# Patient Record
Sex: Male | Born: 1990 | Race: Black or African American | Hispanic: No | Marital: Single | Smoking: Never smoker
Health system: Southern US, Community
[De-identification: ages and names within clinical notes are randomized; demographics above are authoritative.]

---

## 2014-04-20 ENCOUNTER — Encounter (HOSPITAL_COMMUNITY): Payer: Self-pay | Admitting: Emergency Medicine

## 2014-04-20 ENCOUNTER — Emergency Department (HOSPITAL_COMMUNITY)
Admission: EM | Admit: 2014-04-20 | Discharge: 2014-04-20 | Disposition: A | Discharge status: Home / Self Care | Attending: Emergency Medicine | Admitting: Emergency Medicine

## 2014-04-20 DIAGNOSIS — J029 Acute pharyngitis, unspecified: Secondary | ICD-10-CM | POA: Diagnosis present

## 2014-04-20 DIAGNOSIS — J36 Peritonsillar abscess: Secondary | ICD-10-CM | POA: Insufficient documentation

## 2014-04-20 LAB — RAPID STREP SCREEN (MED CTR MEBANE ONLY): Streptococcus, Group A Screen (Direct): NEGATIVE

## 2014-04-20 MED ORDER — AMOXICILLIN 500 MG PO CAPS: 1000.0000 mg | ORAL_CAPSULE | Freq: Three times a day (TID) | ORAL | Status: AC

## 2014-04-20 MED ORDER — IBUPROFEN 100 MG/5ML PO SUSP: 400.0000 mg | Freq: Four times a day (QID) | ORAL | Status: AC | PRN

## 2014-04-20 MED ORDER — AMOXICILLIN 500 MG PO CAPS
1000.0000 mg | ORAL_CAPSULE | Freq: Once | ORAL | Status: AC
  Administered 2014-04-20: 1000 mg via ORAL
  Filled 2014-04-20: qty 2

## 2014-04-20 MED ORDER — HYDROCODONE-ACETAMINOPHEN 7.5-325 MG/15ML PO SOLN: 10.0000 mL | Freq: Four times a day (QID) | ORAL | Status: AC | PRN

## 2014-04-20 NOTE — ED Provider Notes (Signed)
CSN: [id number]     Arrival date & time 04/20/14  [id number] History   First MD Initiated Contact with Patient 04/20/14 [phone]     Chief Complaint  Patient presents with  . Sore Throat     (Consider location/radiation/quality/duration/timing/severity/associated sxs/prior Treatment) HPI  Pt is a healthy 24yo male, presenting to ED with c/o sore throat that started 1 week ago, significantly worse starting yesterday, associated decreased appetite due to pain with swallowing, pain is 8/10 at worst, as well as mild intermittent headache since last night. He reports having had strep throat 2 other times since Thanksgiving of 2015, was admitted in the beginning of December for abscesses to his tonsils.  States he was admitted for 2 nights in [place] for an abscess at the end of November 2015. States the first night he was not able to be seen by the specialist but they did drain it.  States this time feels similar but due to having been through the same scenario just 2 months ago, pt feels he is better able to cope with his symptoms.  He has tried ibuprofen as home with mild to moderate relief. Has been able to keep down fluids. Denies difficulty breathing. No sick contacts or recent travel.    History reviewed. No pertinent past medical history. History reviewed. No pertinent past surgical history. No family history on file. History  Substance Use Topics  . Smoking status: Never Smoker   . Smokeless tobacco: Not on file  . Alcohol Use: No    Review of Systems  Constitutional: Positive for appetite change. Negative for fever and chills.  HENT: Positive for sore throat, trouble swallowing and voice change. Negative for congestion and drooling.   Respiratory: Negative for cough, shortness of breath, wheezing and stridor.   Gastrointestinal: Negative for nausea, vomiting, abdominal pain and diarrhea.  Neurological: Positive for headaches.  All other systems reviewed and are negative.     Allergies   Review of patient's allergies indicates no known allergies.  Home Medications   Prior to Admission medications   Medication Sig Start Date End Date Taking? Authorizing Provider  amoxicillin (AMOXIL) 500 MG capsule Take 2 capsules (1,000 mg total) by mouth 3 (three) times daily. Take 2 capsules 3 times daily for 3 days, then 1 capsule 3 times daily for 7 days 04/20/14   Junius FinnerErin O'Malley, PA-C  HYDROcodone-acetaminophen (HYCET) 7.5-325 mg/15 ml solution Take 10 mLs by mouth every 6 (six) hours as needed for moderate pain or severe pain. 04/20/14   Junius FinnerErin O'Malley, PA-C  ibuprofen (ADVIL,MOTRIN) 100 MG/5ML suspension Take 20 mLs (400 mg total) by mouth every 6 (six) hours as needed for fever, mild pain or moderate pain. 04/20/14   Junius FinnerErin O'Malley, PA-C   BP 144/79 mmHg  Pulse 102  Temp(Src) 98.4 F (36.9 C) (Oral)  Resp 18  SpO2 99% Physical Exam  Constitutional: He appears well-developed and well-nourished.  Pt sitting on side of exam bed, appears well, non-toxic. NAD  HENT:  Head: Normocephalic and atraumatic.  Right Ear: Hearing, tympanic membrane, external ear and ear canal normal.  Left Ear: Hearing, tympanic membrane, external ear and ear canal normal.  Nose: Nose normal.  Mouth/Throat: Mucous membranes are normal. No trismus in the jaw. Uvula swelling ( mild with slight deviation to the right) present. Oropharyngeal exudate, posterior oropharyngeal edema, posterior oropharyngeal erythema and tonsillar abscesses present.  Managing secretions well  Eyes: Conjunctivae are normal. No scleral icterus.  Neck: Normal range of motion. Neck supple. No  JVD present. No tracheal deviation present. No thyromegaly present.  Mildly muffled voice.  Cardiovascular: Normal rate, regular rhythm and normal heart sounds.   Pulmonary/Chest: Effort normal and breath sounds normal. No stridor. No respiratory distress. He has no wheezes. He has no rales. He exhibits no tenderness.  Abdominal: Soft. Bowel sounds  are normal. He exhibits no distension and no mass. There is no tenderness. There is no rebound and no guarding.  Musculoskeletal: Normal range of motion.  Lymphadenopathy:    He has cervical adenopathy ( right submandibular).  Neurological: He is alert.  Skin: Skin is warm and dry.  Nursing note and vitals reviewed.   ED Course  Procedures (including critical care time) Labs Review Labs Reviewed  RAPID STREP SCREEN  CULTURE, GROUP A STREP    Imaging Review No results found.   EKG Interpretation None      MDM   Final diagnoses:  Peritonsillar abscess   Pt with hx of tonsillar abscess in [place] 1-2 months ago, presenting to ED with sore throat. Exam c/w peritonsillar abscess. No respiratory distress, no stridor. Able to manage secretions.  Rapid strep: negative. Discussed pt with Dr. Effie Shy who also examined pt. Agrees with plan to consult with ENT.  09:54AM: Consulted with Dr. Lazarus Salines, ENT, recommended starting pt on amox 1g TID for 3 days, then 500 TID for 7 days. Pt advised to call office in the morning to schedule an appointment for follow up. Discussed plan with pt who states he would like to try the amoxicillin pills and agrees with tx plan to be discharged home with instructions on f/u with Dr. Lazarus Salines. Encouraged lots of rest and oral hydration.    Pt able to keep down PO amoxicillin and fluids in ED. States he feels comfortable being discharged home. Rx: amoxicillin, lortab and ibuprofen. Home care instructions provided Return precautions provided. Pt verbalized understanding and agreement with tx plan.    Junius Finner, PA-C 04/20/14 1030  Flint Melter, MD 04/20/14 [phone]

## 2014-04-20 NOTE — ED Notes (Signed)
PA At bedside

## 2014-04-20 NOTE — ED Notes (Addendum)
Pt from home c/o sore throat x 1 week. Pt reports this feels like strep throat (he had in nov/dec).  He reports also been admitted in the beginning of dec for abcess to tonsils.

## 2014-04-22 LAB — CULTURE, GROUP A STREP

## 2020-10-30 ENCOUNTER — Emergency Department (HOSPITAL_COMMUNITY): Payer: BC Managed Care – PPO

## 2020-10-30 ENCOUNTER — Encounter (HOSPITAL_COMMUNITY): Payer: Self-pay

## 2020-10-30 ENCOUNTER — Emergency Department (HOSPITAL_COMMUNITY)
Admission: EM | Admit: 2020-10-30 | Discharge: 2020-10-30 | Disposition: A | Discharge status: Home / Self Care | Payer: BC Managed Care – PPO | Attending: Emergency Medicine | Admitting: Emergency Medicine

## 2020-10-30 ENCOUNTER — Other Ambulatory Visit: Payer: Self-pay

## 2020-10-30 DIAGNOSIS — J36 Peritonsillar abscess: Secondary | ICD-10-CM | POA: Insufficient documentation

## 2020-10-30 DIAGNOSIS — J029 Acute pharyngitis, unspecified: Secondary | ICD-10-CM | POA: Diagnosis present

## 2020-10-30 LAB — BASIC METABOLIC PANEL
Anion gap: 10 (ref 5–15)
BUN: 15 mg/dL (ref 6–20)
CO2: 30 mmol/L (ref 22–32)
Calcium: 10.1 mg/dL (ref 8.9–10.3)
Chloride: 100 mmol/L (ref 98–111)
Creatinine, Ser: 1.15 mg/dL (ref 0.61–1.24)
GFR, Estimated: >60 mL/min (ref >60)
Glucose, Bld: 101 mg/dL — ABNORMAL HIGH (ref 70–99)
Potassium: 4 mmol/L (ref 3.5–5.1)
Sodium: 140 mmol/L (ref 135–145)

## 2020-10-30 LAB — CBC
HCT: 48.1 % (ref 39.0–52.0)
Hemoglobin: 15.5 g/dL (ref 13.0–17.0)
MCH: 27.1 pg (ref 26.0–34.0)
MCHC: 32.2 g/dL (ref 30.0–36.0)
MCV: 83.9 fL (ref 80.0–100.0)
Platelets: 339 10*3/uL (ref 150–400)
RBC: 5.73 MIL/uL (ref 4.22–5.81)
RDW: 13.6 % (ref 11.5–15.5)
WBC: 15.7 10*3/uL — ABNORMAL HIGH (ref 4.0–10.5)
nRBC: 0 % (ref 0.0–0.2)

## 2020-10-30 MED ORDER — CLINDAMYCIN HCL 300 MG PO CAPS: 300.0000 mg | ORAL_CAPSULE | Freq: Four times a day (QID) | ORAL | 0 refills | Status: DC

## 2020-10-30 MED ORDER — LACTATED RINGERS IV BOLUS
1000.0000 mL | Freq: Once | INTRAVENOUS | Status: AC
  Administered 2020-10-30: 1000 mL via INTRAVENOUS

## 2020-10-30 MED ORDER — CLINDAMYCIN HCL 300 MG PO CAPS: 300.0000 mg | ORAL_CAPSULE | Freq: Four times a day (QID) | ORAL | 0 refills | Status: AC

## 2020-10-30 MED ORDER — FENTANYL CITRATE (PF) 100 MCG/2ML IJ SOLN
100.0000 ug | Freq: Once | INTRAMUSCULAR | Status: AC
Start: 2020-10-30 — End: 2020-10-30
  Administered 2020-10-30: 100 ug via INTRAVENOUS
  Filled 2020-10-30: qty 2

## 2020-10-30 MED ORDER — CLINDAMYCIN PHOSPHATE 600 MG/50ML IV SOLN
600.0000 mg | Freq: Once | INTRAVENOUS | Status: AC
  Administered 2020-10-30: 600 mg via INTRAVENOUS
  Filled 2020-10-30: qty 50

## 2020-10-30 MED ORDER — FENTANYL CITRATE (PF) 100 MCG/2ML IJ SOLN
100.0000 ug | Freq: Once | INTRAMUSCULAR | Status: AC
  Administered 2020-10-30: 100 ug via INTRAVENOUS
  Filled 2020-10-30: qty 2

## 2020-10-30 MED ORDER — IOHEXOL 350 MG/ML SOLN
100.0000 mL | Freq: Once | INTRAVENOUS | Status: AC | PRN
  Administered 2020-10-30: 75 mL via INTRAVENOUS

## 2020-10-30 NOTE — Procedures (Signed)
Preop diagnosis: Bilateral peritonsillar abscess Postop diagnosis: same Procedure: Incision and drainage of bilateral peritonsillar abscesses Surgeon: Jenne Pane Anesth: Topical with cetacaine and local with 1% lidocaine with 1:100,000 epinephrine Compl: None Findings: Left greater than right abscess drained. Description of procedure: After discussing risks, benefits, and alternatives, the patient was placed in a sitting position.  The oropharynx was sprayed on both sides with cetacaine twice.  The mucosa was then injected with local anesthetic.  An angled incision was made above each tonsil using a 15 blade scalpel.  The left-sided abscess was entered first with an 18 gauge needle aspiration followed by blunt dissection to widely drain the abscess.  The right-sided abscess was able to be entered without needing the needle.  He tolerated the procedure well and was returned to nursing care in stable condition

## 2020-10-30 NOTE — ED Triage Notes (Signed)
Patient arrived with sore throat since Monday. Taking a steroid for symptoms with no relief. Patient spitting secretions and horse speaking.

## 2020-10-30 NOTE — ED Provider Notes (Signed)
[facility]  HOSPITAL-EMERGENCY DEPT Provider Note   CSN: [id number] Arrival date & time: 10/30/20  0349     History Chief Complaint  Patient presents with   Sore Throat    Ryan Melendez is a 30 y.o. male with no medical history presents with gradual, persistent and progressively worsening sore throat onset 5 days ago.  Patient reports he had a globally sore throat.  Was evaluated at [facility] and had a negative strep test.  At that time he was given prednisone but no antibiotic.  Reports that over the last few days his throat has become more and more swollen and painful.  He reports that for the last 24 hours his voice has become very muffled and he is unable to swallow.  Denies fevers or chills.  Denies neck pain or nuchal rigidity.  No previous history of peritonsillar abscess.  No history of diabetes.  No other history of immunocompromise.  No treatments aside from prednisone prior to arrival.  The history is provided by the patient and medical records. No language interpreter was used.      No past medical history on file.  There are no problems to display for this patient.   No past surgical history on file.     No family history on file.  Social History   Tobacco Use   Smoking status: Never  Substance Use Topics   Alcohol use: No    Home Medications Prior to Admission medications   Medication Sig Start Date End Date Taking? Authorizing Provider  amoxicillin (AMOXIL) 500 MG capsule Take 2 capsules (1,000 mg total) by mouth 3 (three) times daily. Take 2 capsules 3 times daily for 3 days, then 1 capsule 3 times daily for 7 days 04/20/14   Lurene Shadow, PA-C  HYDROcodone-acetaminophen (HYCET) 7.5-325 mg/15 ml solution Take 10 mLs by mouth every 6 (six) hours as needed for moderate pain or severe pain. 04/20/14   Lurene Shadow, PA-C  ibuprofen (ADVIL,MOTRIN) 100 MG/5ML suspension Take 20 mLs (400 mg total) by mouth every 6 (six) hours as needed for fever, mild pain  or moderate pain. 04/20/14   Lurene Shadow, PA-C    Allergies    Patient has no known allergies.  Review of Systems   Review of Systems  Constitutional:  Negative for appetite change, diaphoresis, fatigue, fever and unexpected weight change.  HENT:  Positive for drooling, sore throat, trouble swallowing and voice change. Negative for mouth sores.   Eyes:  Negative for visual disturbance.  Respiratory:  Negative for cough, chest tightness, shortness of breath and wheezing.   Cardiovascular:  Negative for chest pain.  Gastrointestinal:  Negative for abdominal pain, constipation, diarrhea, nausea and vomiting.  Endocrine: Negative for polydipsia, polyphagia and polyuria.  Genitourinary:  Negative for dysuria, frequency, hematuria and urgency.  Musculoskeletal:  Negative for back pain and neck stiffness.  Skin:  Negative for rash.  Allergic/Immunologic: Negative for immunocompromised state.  Neurological:  Negative for syncope, light-headedness and headaches.  Hematological:  Does not bruise/bleed easily.  Psychiatric/Behavioral:  Negative for sleep disturbance. The patient is not nervous/anxious.    Physical Exam Updated Vital Signs BP (!) 155/87 (BP Location: Right Arm)   Pulse 95   Temp 99.5 F (37.5 C) (Oral)   Resp 17   SpO2 99%   Physical Exam Vitals and nursing note reviewed.  Constitutional:      General: He is not in acute distress.    Appearance: He is not diaphoretic.  HENT:     Head: Normocephalic.     Right Ear: External ear normal.     Left Ear: External ear normal.     Nose: No congestion or rhinorrhea.     Mouth/Throat:     Lips: Pink.     Mouth: Mucous membranes are moist.     Tongue: No lesions.     Pharynx: Pharyngeal swelling and uvula swelling present.      Comments: Tonsils enlarged and erythematous.  No exudate. Patient consistently spitting into a cup.  Hot potato voice. Eyes:     General: No scleral icterus.    Conjunctiva/sclera: Conjunctivae  normal.  Cardiovascular:     Rate and Rhythm: Normal rate and regular rhythm.     Pulses: Normal pulses.          Radial pulses are 2+ on the right side and 2+ on the left side.  Pulmonary:     Effort: No tachypnea, accessory muscle usage, prolonged expiration, respiratory distress or retractions.     Breath sounds: No stridor.     Comments: Equal chest rise. No increased work of breathing. Abdominal:     General: There is no distension.     Palpations: Abdomen is soft.     Tenderness: There is no abdominal tenderness. There is no guarding or rebound.  Musculoskeletal:     Cervical back: Normal range of motion.     Comments: Moves all extremities equally and without difficulty.  Skin:    General: Skin is warm and dry.     Capillary Refill: Capillary refill takes less than 2 seconds.  Neurological:     Mental Status: He is alert.     GCS: GCS eye subscore is 4. GCS verbal subscore is 5. GCS motor subscore is 6.     Comments: Speech is clear and goal oriented.  Psychiatric:        Mood and Affect: Mood normal.    ED Results / Procedures / Treatments   Labs (all labs ordered are listed, but only abnormal results are displayed) Labs Reviewed  CBC - Abnormal; Notable for the following components:      Result Value   WBC 15.7 (*)    All other components within normal limits  BASIC METABOLIC PANEL - Abnormal; Notable for the following components:   Glucose, Bld 101 (*)    All other components within normal limits      Procedures Procedures   Medications Ordered in ED Medications  clindamycin (CLEOCIN) IVPB 600 mg (600 mg Intravenous New Bag/Given 10/30/20 0630)    ED Course  I have reviewed the triage vital signs and the nursing notes.  Pertinent labs & imaging results that were available during my care of the patient were reviewed by me and considered in my medical decision making (see chart for details).    MDM Rules/Calculators/A&P                            Patient presents with several days of sore throat.  Large peritonsillar abscess noted on clinical exam.  Will start antibiotics and consult with ENT.  6:51 AM Labs with leukocytosis.  Awaiting callback from ENT.  Patient has received clindamycin.  At shift change care transferred to [facility] who will discuss with ENT and dispo accordingly.  Final Clinical Impression(s) / ED Diagnoses Final diagnoses:  Peritonsillar abscess    Rx / DC Orders ED Discharge Orders  None        Alejah Aristizabal, Boyd Kerbs 10/30/20 2426    Dione Booze, MD 10/30/20 [phone]

## 2020-10-30 NOTE — Discharge Instructions (Addendum)
You are seen in the ER today for your throat pain and difficulty swallowing.  You were found to have abscesses in both of your tonsils.  These were drained in the ER by Dr. Jenne Pane, the ear nose and throat provider.  You administered your first dose of antibiotics by IV and have been prescribed antibiotics to take 4 times a day for the next week.  Please take them as prescribed for the entire course.  Your symptoms should significantly improve, however if you have any persistent symptoms or any concerns you may follow-up with Dr. Jenne Pane in the office.  Please see his contact information listed below.  Return to the ER with any worsening throat pain, difficulty swallowing, nausea or vomiting does not stop, fevers, chills despite antibiotics, or any other new severe symptoms.

## 2020-10-30 NOTE — Consult Note (Signed)
Reason for Consult: Peritonsillar abscess Referring Physician: ER  Ryan Melendez is an 30 y.o. male.  HPI: 30 year old male who developed sore throat four days ago that has worsened and involves both sides.  He was prescribed a steroid but no antibiotic.  He came to the ER this morning due to difficulty swallowing liquids.  A CT scan was performed.  He has received IV antibiotic and fluids.  History reviewed. No pertinent past medical history.  No past surgical history on file.  No family history on file.  Social History:  reports that he does not drink alcohol. No history on file for tobacco use and drug use.  Allergies: No Known Allergies  Medications: I have reviewed the patient's current medications.  Results for orders placed or performed during the hospital encounter of 10/30/20 (from the past 48 hour(s))  CBC     Status: Abnormal   Collection Time: 10/30/20  5:52 AM  Result Value Ref Range   WBC 15.7 (H) 4.0 - 10.5 K/uL   RBC 5.73 4.22 - 5.81 MIL/uL   Hemoglobin 15.5 13.0 - 17.0 g/dL   HCT 42.7 06.2 - 37.6 %   MCV 83.9 80.0 - 100.0 fL   MCH 27.1 26.0 - 34.0 pg   MCHC 32.2 30.0 - 36.0 g/dL   RDW 28.3 15.1 - 76.1 %   Platelets 339 150 - 400 K/uL   nRBC 0.0 0.0 - 0.2 %    Comment: Performed at [facility], [address], [place], [place] [address]  Basic metabolic panel     Status: Abnormal   Collection Time: 10/30/20  5:52 AM  Result Value Ref Range   Sodium 140 135 - 145 mmol/L   Potassium 4.0 3.5 - 5.1 mmol/L   Chloride 100 98 - 111 mmol/L   CO2 30 22 - 32 mmol/L   Glucose, Bld 101 (H) 70 - 99 mg/dL    Comment: Glucose reference range applies only to samples taken after fasting for at least 8 hours.   BUN 15 6 - 20 mg/dL   Creatinine, Ser 1.06 0.61 - 1.24 mg/dL   Calcium 26.9 8.9 - 48.5 mg/dL   GFR, Estimated >46 >27 mL/min    Comment: (NOTE) Calculated using the CKD-EPI Creatinine Equation (2021)    Anion gap 10 5 - 15    Comment: Performed  at [facility], [address] [place], [place] [address]    CT Soft Tissue Neck W Contrast  Result Date: 10/30/2020 CLINICAL DATA:  Sore throat for 5 days, started on the right now bilateral EXAM: CT NECK WITH CONTRAST TECHNIQUE: Multidetector CT imaging of the neck was performed using the standard protocol following the bolus administration of intravenous contrast. CONTRAST:  7mL OMNIPAQUE IOHEXOL 350 MG/ML SOLN COMPARISON:  None. FINDINGS: Pharynx and larynx: There is a 1.9 cm AP x 1.4 cm TV by 2.4 cm cc peripherally enhancing collection in the left palatine tonsil. There is a more ill-defined peripherally enhancing collection in the right palatine tonsil measuring up to approximately 1.7 cm AP x 1.3 cm TV by 3.2 cm cc. Swelling associated with these collections results and partial effacement of the oropharyngeal airway. The larynx is normal. Salivary glands: The parotid and submandibular glands are normal. Thyroid: Normal. Lymph nodes: There are prominent bilateral level II a lymph nodes measuring up to 1.7 cm on the left and 1.3 cm on the right, likely reactive. Vascular: Unremarkable. Limited intracranial: The imaged portions of  the brain and posterior fossa are unremarkable. Visualized orbits: The imaged orbits are unremarkable. Mastoids and visualized paranasal sinuses: There is mild mucosal thickening in the maxillary sinuses. The imaged mastoid air cells are clear. Skeleton: There is no acute osseous abnormality. Upper chest: The lung apices are clear. Other: There is significant cerumen in left external auditory canal. IMPRESSION: Left tonsillar abscess measuring 1.9 cm x 1.4 cm x 2.4 cm and right tonsillar phlegmon versus early abscess measuring 1.7 cm x 1.3 cm x 3.2 cm. Swelling associated with these collections results and partial effacement of the oropharyngeal airway. Electronically Signed   By: Lesia Hausen MD   On: 10/30/2020 08:58    Review of Systems  HENT:  Positive  for drooling, sore throat, trouble swallowing and voice change.   All other systems reviewed and are negative. Blood pressure (!) 151/77, pulse 97, temperature 99.5 F (37.5 C), temperature source Oral, resp. rate 18, SpO2 94 %. Physical Exam Constitutional:      Appearance: He is well-developed and normal weight.  HENT:     Head: Normocephalic and atraumatic.     Right Ear: Ear canal normal.     Left Ear: Ear canal normal.     Nose: Nose normal.     Mouth/Throat:     Mouth: Mucous membranes are moist.     Comments: Mild trismus.  Oropharynx crowded.  Muffled voice. Eyes:     Extraocular Movements: Extraocular movements intact.     Conjunctiva/sclera: Conjunctivae normal.     Pupils: Pupils are equal, round, and reactive to light.  Cardiovascular:     Rate and Rhythm: Normal rate.  Pulmonary:     Effort: Pulmonary effort is normal.  Musculoskeletal:     Cervical back: Normal range of motion.  Skin:    General: Skin is warm and dry.  Neurological:     General: No focal deficit present.     Mental Status: He is alert and oriented to person, place, and time.  Psychiatric:        Mood and Affect: Mood normal.        Behavior: Behavior normal.        Thought Content: Thought content normal.        Judgment: Judgment normal.    Assessment/Plan: Bilateral peritonsillar abscess  I personally reviewed his neck CT demonstrating bilateral peritonsillar abscess.  I recommended and performed bilateral incision and drainage.  See procedure note.  He can be discharged on oral clindamycin and can follow-up as needed.  Christia Reading 10/30/2020, 11:19 AM

## 2020-10-30 NOTE — ED Provider Notes (Signed)
Physical Exam  BP (!) 155/87   Pulse 95   Temp 99.5 F (37.5 C) (Oral)   Resp 18   SpO2 99%   Physical Exam  ED Course/Procedures   Clinical Course as of 10/30/20 1225  Fri Oct 30, 2020  0712 Incorrect provider paged by secretary, repaged correct ENT service at this time.  [RS]  0719 Consult  call from ENT provider Dr. Jenne Pane, who recommends IV fluid resuscitation and then contacting his office once it opens to schedule time that he may present to the office this afternoon for drainage of the abscess.  He agrees with plan for clindamycin in the outpatient setting.  I appreciate his collaboration in the care of this patient. [RS]  1015 Repeat consult called to Dr. Jenne Pane after CT that which revealed bilateral PTAs.  He is agreeable to come see this patient in the emergency department and requesting ENT provider cart be brought to the bedside.  We will proceed with this and await his consult in the ED.  I appreciate his collaboration in the care of this patient. [RS]  1102 Dr. Jenne Pane, ENT, at the bedside.  [RS]    Clinical Course User Index [RS] Rechelle Niebla R, PA-C    .Critical Care  Date/Time: 10/30/2020 12:27 PM Performed by: Paris Lore, PA-C Authorized by: Paris Lore, PA-C   Critical care provider statement:    Critical care time (minutes):  45   Critical care was necessary to treat or prevent imminent or life-threatening deterioration of the following conditions: Bilateral peritonsillar abscess, requiring multiple consults.   Critical care was time spent personally by me on the following activities:  Discussions with consultants, evaluation of patient's response to treatment, examination of patient, ordering and performing treatments and interventions, ordering and review of laboratory studies, ordering and review of radiographic studies, pulse oximetry, re-evaluation of patient's condition, obtaining history from patient or surrogate and review of old  charts  MDM   Care of this patient assumed from preceding ED provider Dierdre Forth, PA-C at time of shift change.  In brief this is a 30 year old male presenting with gradually worsening sore throat x5 days now with some difficulty swallowing, unable to swallow secretions.  Was prescribed steroid but no antibiotic and has gradually worsened over the last 24 hours.  Voice is extremely muffled and is unable to swallow secretions.  Per preceding ED provider physical exam significant concerning for peritonsillar abscess on the right, with uvular deviation to the left.  At time of shift change patient is awaiting ENT consult.  I personally examined this patient and agree with significant concern for peritonsillar abscess on the right.  Patient able to speak, unable to swallow his secretions.  Requesting pain medication.  Has received IV Cleocin.  Consult to ENT, Dr. Jenne Pane who recommends outpatient drainage of the abscess this afternoon in his office.  He is agreeable with clindamycin to treat this gentlemen's peritonsillar abscess.  Given patient is protecting his airway, do not feel this is an unreasonable disposition plan.  We will keep patient in the emergency department for analgesia and hydration until ENT office opens and an appointment can be secured for him.  Unable to contact ENT office for this patient to secure appointment for drainage for this afternoon.  Subsequently patient was evaluated at the bedside by attending physician who recommended proceeding with CT soft tissues of the neck.  This did reveal bilateral peritonsillar abscesses, roughly 2 x 2 on the left and 2  x 3 on the right.  Repeat consult was paged to Dr. Jenne Pane, ENT to discuss this new finding; given bilateral nature of the abscesses he is willing to see the patient in the emergency department to attempt drainage.  Discussion with Dr. Jenne Pane following successful drainage of bilateral peritonsillar abscesses.  He recommends  proceeding with outpatient antibiotics with clindamycin and as needed follow-up in the office with him for worsening or persistent symptoms.  I appreciate his collaboration in the care of this patient.  Given reassuring vital signs and successful drainage, there is no indication for admission to the hospital this time.  Patient reevaluated after procedure with improvement in his pain and increased ease of swallowing.  He is agreeable with plan for outpatient antibiotics and discharge.  No further work-up warranted in ED at this time.  Quamaine voiced understanding of his medical evaluation and treatment plan.  He was questions answered to his expressed satisfaction.  Return precautions were given.  Patient is stable and appropriate for discharge at this time.  This chart was dictated using voice recognition software, Dragon. Despite the best efforts of this provider to proofread and correct errors, errors may still occur which can change documentation meaning.     Sherrilee Gilles 10/30/20 1227    Gloris Manchester, MD 10/30/20 [phone]

## 2021-12-13 ENCOUNTER — Emergency Department (HOSPITAL_COMMUNITY)
Admission: EM | Admit: 2021-12-13 | Discharge: 2021-12-14 | Disposition: A | Discharge status: Home / Self Care | Payer: 59 | Attending: Emergency Medicine | Admitting: Emergency Medicine

## 2021-12-13 ENCOUNTER — Other Ambulatory Visit: Payer: Self-pay

## 2021-12-13 ENCOUNTER — Encounter (HOSPITAL_COMMUNITY): Payer: Self-pay | Admitting: Emergency Medicine

## 2021-12-13 DIAGNOSIS — J020 Streptococcal pharyngitis: Secondary | ICD-10-CM | POA: Diagnosis not present

## 2021-12-13 DIAGNOSIS — J029 Acute pharyngitis, unspecified: Secondary | ICD-10-CM | POA: Diagnosis present

## 2021-12-13 DIAGNOSIS — Z20822 Contact with and (suspected) exposure to covid-19: Secondary | ICD-10-CM | POA: Insufficient documentation

## 2021-12-13 LAB — GROUP A STREP BY PCR: Group A Strep by PCR: DETECTED — AB

## 2021-12-13 NOTE — ED Provider Triage Note (Signed)
Emergency Medicine Provider Triage Evaluation Note  Ryan Melendez , a 31 y.o. male  was evaluated in triage.  Pt complains of sore throat, started yesterday, pain with swallowing, but is able to tolerate his own secretions, denies muffled tone voice, no digestion fevers chills cough general body aches.  States he has had a sore throat in the past and was an abscess, unclear of its this..  Review of Systems  Positive: Sore throat, Negative: Fevers, chills  Physical Exam  BP 122/89 (BP Location: Right Arm)   Pulse 78   Temp 98.9 F (37.2 C)   Resp 18   Ht 5\' 10"  (1.778 m)   Wt 88.5 kg   BMI 27.98 kg/m  Gen:   Awake, no distress  Resp:  Normal effort  MSK:   Moves extremities without difficulty  Other:    Medical Decision Making  Medically screening exam initiated at 10:53 PM.  Appropriate orders placed.  Ryan Melendez was informed that the remainder of the evaluation will be completed by another provider, this initial triage assessment does not replace that evaluation, and the importance of remaining in the ED until their evaluation is complete.  Will need further work-up.   Marcello Fennel, PA-C 12/13/21 2254

## 2021-12-13 NOTE — ED Triage Notes (Signed)
Pt reports sore throat since yesterday.  Pt is able to swallow saliva and is speaking in complete sentences.  Denies n/v/fevers

## 2021-12-14 LAB — RESP PANEL BY RT-PCR (FLU A&B, COVID) ARPGX2
Influenza A by PCR: NEGATIVE
Influenza B by PCR: NEGATIVE
SARS Coronavirus 2 by RT PCR: NEGATIVE

## 2021-12-14 MED ORDER — PENICILLIN G BENZATHINE 1200000 UNIT/2ML IM SUSY
1.2000 10*6.[IU] | PREFILLED_SYRINGE | Freq: Once | INTRAMUSCULAR | Status: AC
  Administered 2021-12-14: 1.2 10*6.[IU] via INTRAMUSCULAR
  Filled 2021-12-14: qty 2

## 2021-12-14 MED ORDER — DEXAMETHASONE 4 MG PO TABS
10.0000 mg | ORAL_TABLET | Freq: Once | ORAL | Status: AC
  Administered 2021-12-14: 10 mg via ORAL
  Filled 2021-12-14: qty 3

## 2021-12-14 NOTE — Discharge Instructions (Signed)
You were seen today for sore throat.  Your strep testing is positive.  Take Tylenol or ibuprofen as needed for fevers.  You were given a dose of antibiotics while in the emergency department.  You should start feeling better within the next 12 to 24 hours.

## 2021-12-14 NOTE — ED Provider Notes (Signed)
[facility] EMERGENCY DEPARTMENT Provider Note   CSN: [id number] Arrival date & time: 12/13/21  2144     History  Chief Complaint  Patient presents with   Sore Throat    Ryan Melendez is a 31 y.o. male.  HPI     This is a 31 year old male who presents with sore throat.  Patient reports pain with swallowing over the last 24 hours.  He reports chills without fevers.  Denies upper respiratory symptoms including congestion or cough.  No known sick contacts.  Patient has not taken anything at home for symptoms.  Home Medications Prior to Admission medications   Medication Sig Start Date End Date Taking? Authorizing Provider  amoxicillin (AMOXIL) 500 MG capsule Take 2 capsules (1,000 mg total) by mouth 3 (three) times daily. Take 2 capsules 3 times daily for 3 days, then 1 capsule 3 times daily for 7 days 04/20/14   Noe Gens, PA-C  HYDROcodone-acetaminophen (HYCET) 7.5-325 mg/15 ml solution Take 10 mLs by mouth every 6 (six) hours as needed for moderate pain or severe pain. 04/20/14   Noe Gens, PA-C  ibuprofen (ADVIL,MOTRIN) 100 MG/5ML suspension Take 20 mLs (400 mg total) by mouth every 6 (six) hours as needed for fever, mild pain or moderate pain. 04/20/14   Noe Gens, PA-C      Allergies    Patient has no known allergies.    Review of Systems   Review of Systems  Constitutional:  Positive for chills.  HENT:  Positive for sore throat and trouble swallowing.   All other systems reviewed and are negative.   Physical Exam Updated Vital Signs BP 130/81 (BP Location: Right Arm)   Pulse 95   Temp 98.8 F (37.1 C) (Oral)   Resp 16   Ht 1.778 m (5\' 10" )   Wt 88.5 kg   SpO2 99%   BMI 27.98 kg/m  Physical Exam Vitals and nursing note reviewed.  Constitutional:      Appearance: He is well-developed. He is not ill-appearing.  HENT:     Head: Normocephalic and atraumatic.     Nose: No congestion.     Mouth/Throat:     Comments: Moist,  tolerating secretions, bilateral 1+ tonsillar enlargement with tonsillar exudate noted, uvula midline Eyes:     Pupils: Pupils are equal, round, and reactive to light.  Cardiovascular:     Rate and Rhythm: Normal rate and regular rhythm.  Pulmonary:     Effort: Pulmonary effort is normal. No respiratory distress.  Abdominal:     Tenderness: There is no abdominal tenderness.  Musculoskeletal:     Cervical back: Neck supple.  Lymphadenopathy:     Cervical: Cervical adenopathy present.  Skin:    General: Skin is warm and dry.  Neurological:     Mental Status: He is alert and oriented to person, place, and time.  Psychiatric:        Mood and Affect: Mood normal.     ED Results / Procedures / Treatments   Labs (all labs ordered are listed, but only abnormal results are displayed) Labs Reviewed  GROUP A STREP BY PCR - Abnormal; Notable for the following components:      Result Value   Group A Strep by PCR DETECTED (*)    All other components within normal limits  RESP PANEL BY RT-PCR (FLU A&B, COVID) ARPGX2    EKG None  Radiology No results found.  Procedures Procedures    Medications Ordered  in ED Medications  penicillin g benzathine (BICILLIN LA) 1200000 UNIT/2ML injection 1.2 Million Units (has no administration in time range)  dexamethasone (DECADRON) tablet 10 mg (has no administration in time range)    ED Course/ Medical Decision Making/ A&P                           Medical Decision Making Risk Prescription drug management.   This patient presents to the ED for concern of sore throat, this involves an extensive number of treatment options, and is a complaint that carries with it a high risk of complications and morbidity.  I considered the following differential and admission for this acute, potentially life threatening condition.  The differential diagnosis includes viral pharyngitis, strep pharyngitis, deep space infection such as peritonsillar  abscess  MDM:    This is a 32 year old male who presents with sore throat.  He is nontoxic and vital signs are reassuring.  He has evidence of tonsillar exudate and bilateral tonsillar enlargement.  Appear symmetric.  Low suspicion for peritonsillar abscess at this time.  He is tolerating secretions.  No airway compromise.  Labs reviewed.  COVID and influenza negative.  Strep testing positive.  Patient was given IM Bicillin and Decadron for swelling.  We discussed supportive measures.  (Labs, imaging, consults)  Labs: I Ordered, and personally interpreted labs.  The pertinent results include: COVID, influenza, strep testing  Imaging Studies ordered: I ordered imaging studies including none I independently visualized and interpreted imaging. I agree with the radiologist interpretation  Additional history obtained from chart review.  External records from outside source obtained and reviewed including prior evaluations  Cardiac Monitoring: The patient was maintained on a cardiac monitor.  I personally viewed and interpreted the cardiac monitored which showed an underlying rhythm of: Sinus rhythm  Reevaluation: After the interventions noted above, I reevaluated the patient and found that they have :stayed the same  Social Determinants of Health: Lives independently  Disposition: Discharge  Co morbidities that complicate the patient evaluation History reviewed. No pertinent past medical history.   Medicines Meds ordered this encounter  Medications   penicillin g benzathine (BICILLIN LA) 1200000 UNIT/2ML injection 1.2 Million Units    Order Specific Question:   Antibiotic Indication:    Answer:   Pharyngitis   dexamethasone (DECADRON) tablet 10 mg    I have reviewed the patients home medicines and have made adjustments as needed  Problem List / ED Course: Problem List Items Addressed This Visit   None Visit Diagnoses     Strep throat    -  Primary                    Final Clinical Impression(s) / ED Diagnoses Final diagnoses:  Strep throat    Rx / DC Orders ED Discharge Orders     None         Shon Baton, MD 12/14/21 [phone]

## 2022-04-03 IMAGING — CT CT NECK W/ CM
3 of 4 series · 13 of 35 positions shown, 16 images · IV contrast (OMNIPAQUE 350)
Comparison: None.

CLINICAL DATA: Sore throat for 5 days, started on the right now
bilateral

EXAM:
CT NECK WITH CONTRAST
TECHNIQUE: Multidetector CT imaging of the neck was performed using the
standard protocol following the bolus administration of intravenous
contrast.
CONTRAST:  75mL OMNIPAQUE IOHEXOL 350 MG/ML SOLN

[Series 2: axial neck · axial · 0.52mm/px · z∈[-287,-119]mm · 5 of 127 slices shown, 7 images]
[im 22/127  soft-tissue]
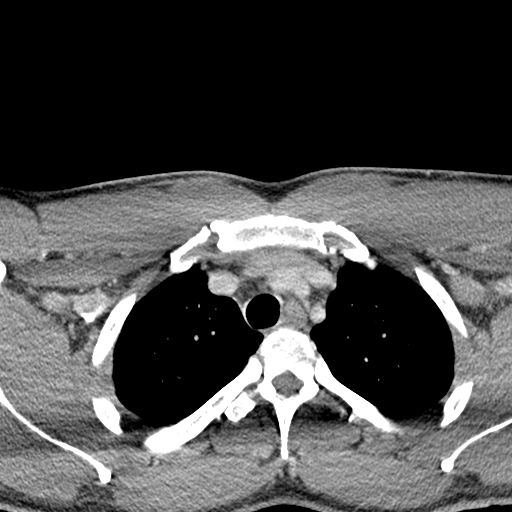
[im 22/127  bone]
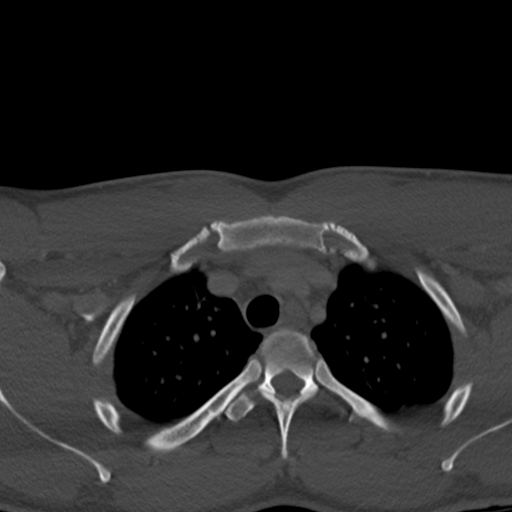
[im 43/127  bone]
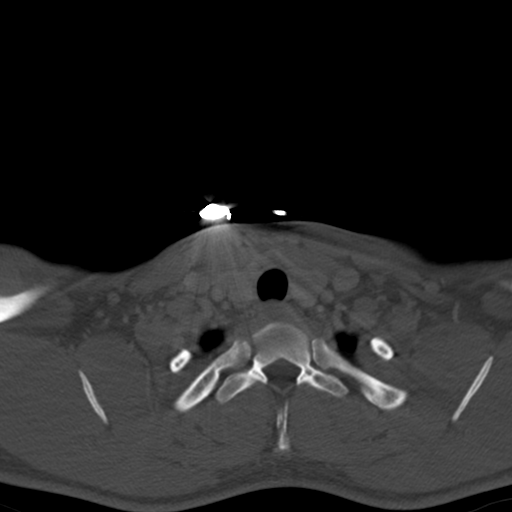
[im 64/127  bone]
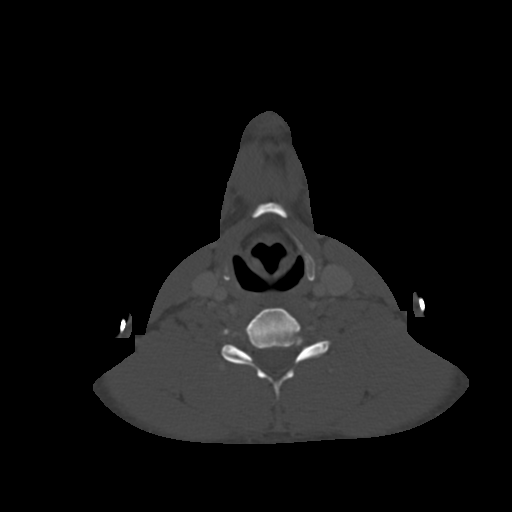
[im 85/127  bone]
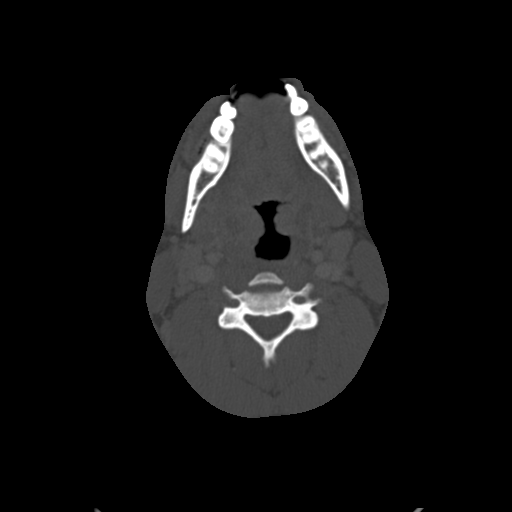
[im 106/127  soft-tissue]
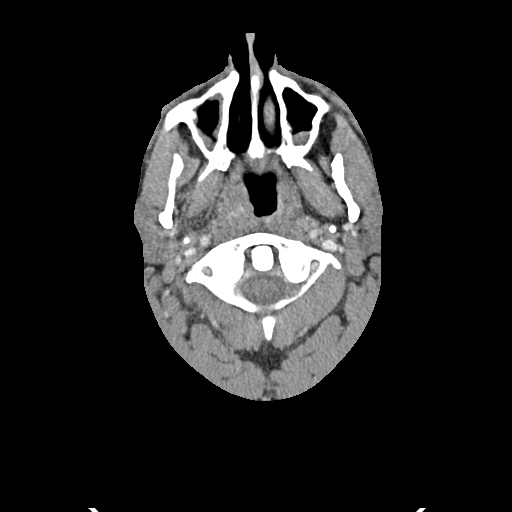
[im 106/127  bone]
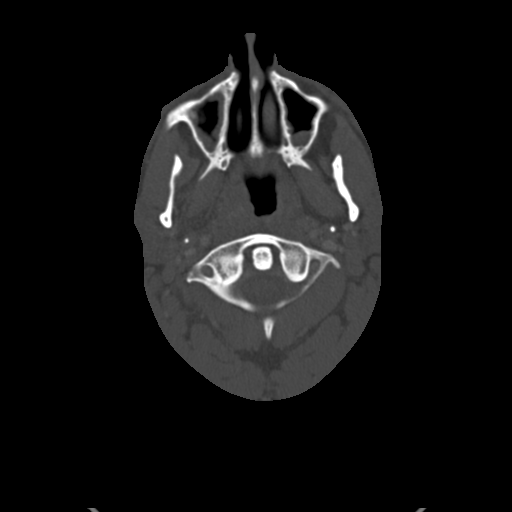

[Series 6: coronal · coronal · 0.55mm/px · 3 of 101 slices shown]
[im 31/101  bone]
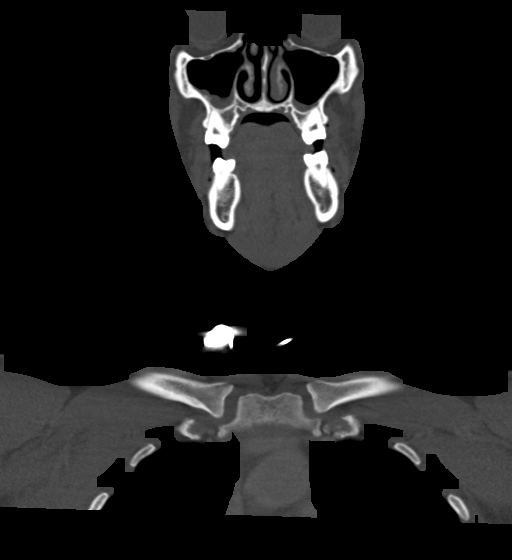
[im 44/101  bone]
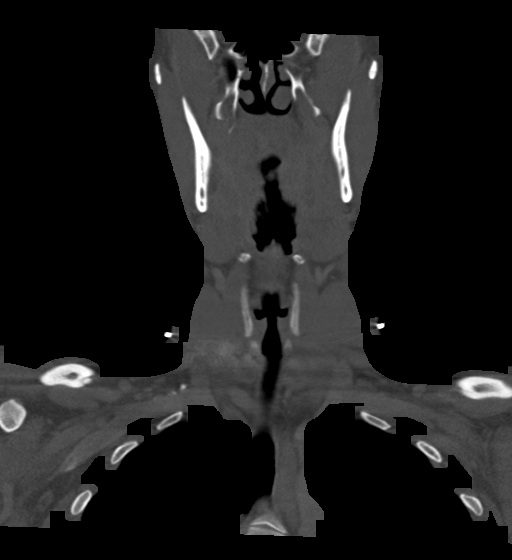
[im 57/101  bone]
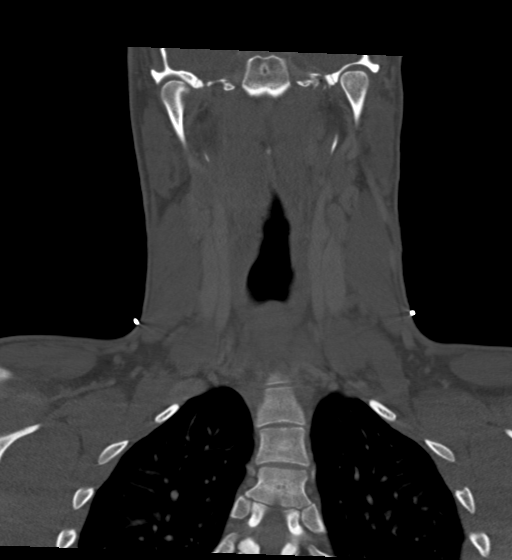

[Series 7: sagittal · sagittal · 0.42mm/px · 5 of 101 slices shown, 6 images]
[im 34/101  bone]
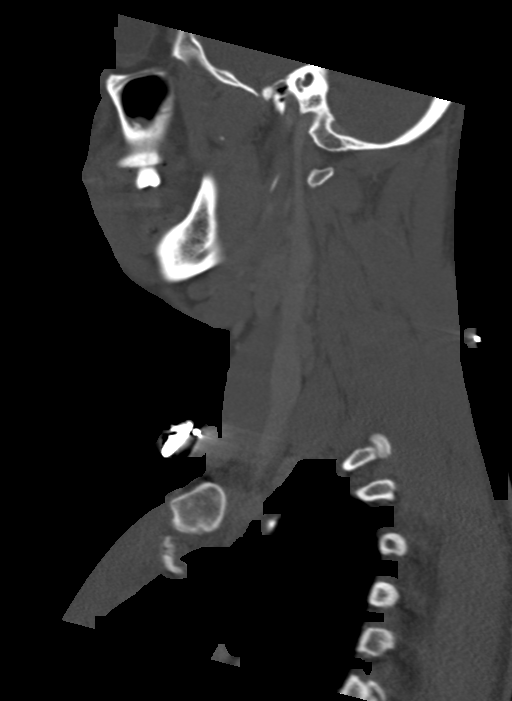
[im 42/101  bone]
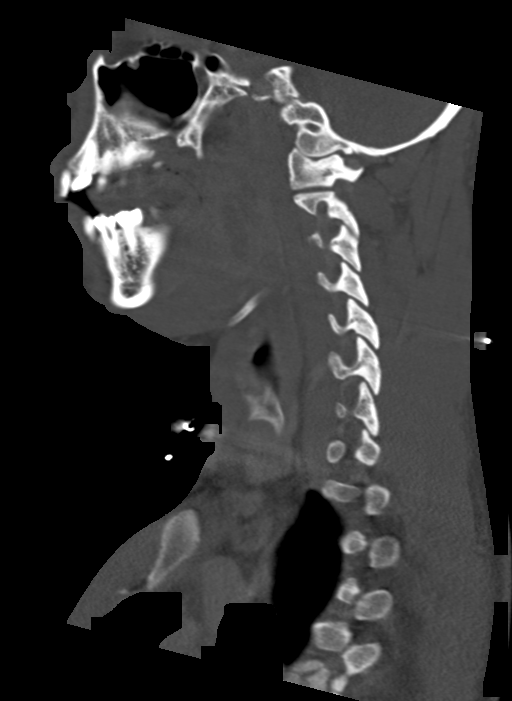
[im 51/101  soft-tissue]
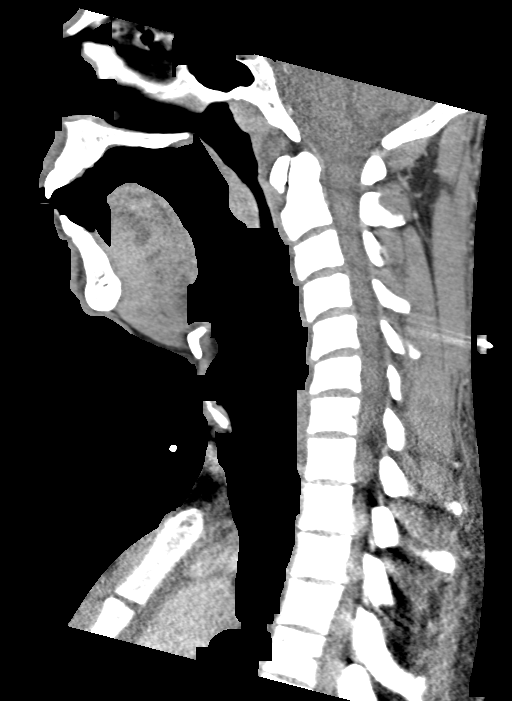
[im 51/101  bone]
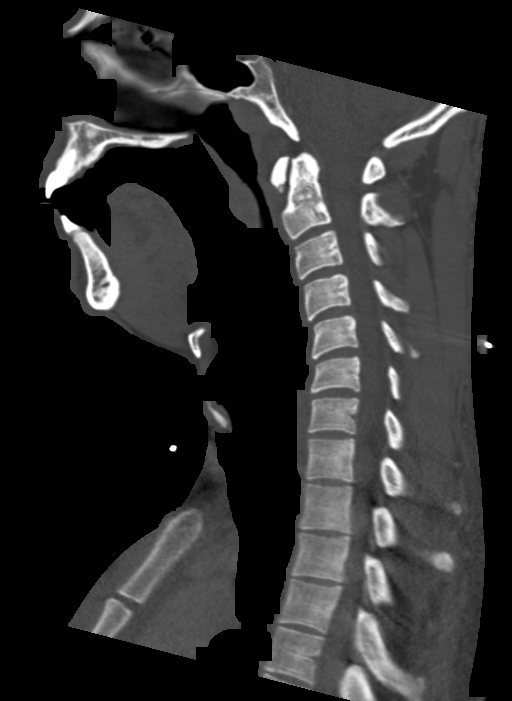
[im 59/101  bone]
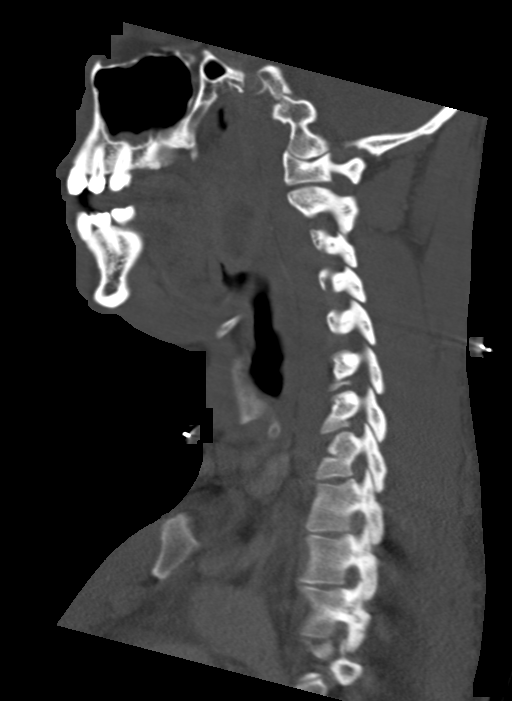
[im 67/101  bone]
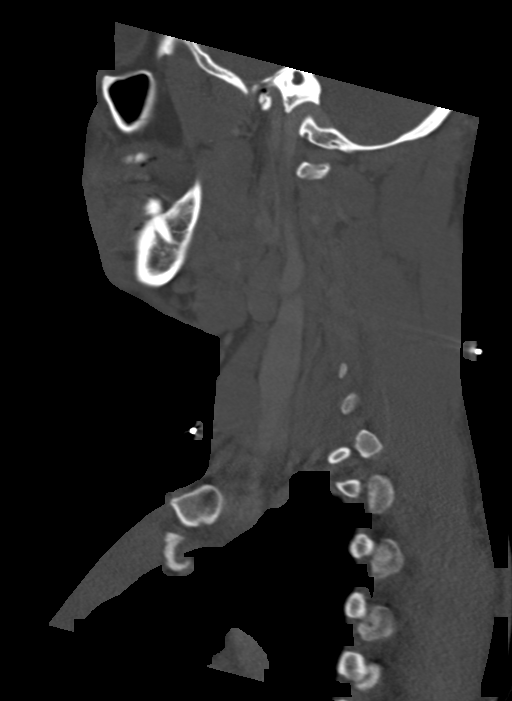

[13 of 35 positions shown; findings below may reference images not displayed]

FINDINGS: Pharynx and larynx: There is a 1.9 cm AP x 1.4 cm TV by 2.4 cm cc
peripherally enhancing collection in the left palatine tonsil. There
is a more ill-defined peripherally enhancing collection in the right
palatine tonsil measuring up to approximately 1.7 cm AP x 1.3 cm TV
by 3.2 cm cc. Swelling associated with these collections results and
partial effacement of the oropharyngeal airway.

The larynx is normal.

Salivary glands: The parotid and submandibular glands are normal.

Thyroid: Normal.

Lymph nodes: There are prominent bilateral level II a lymph nodes
measuring up to 1.7 cm on the left and 1.3 cm on the right, likely
reactive.

Vascular: Unremarkable.

Limited intracranial: The imaged portions of the brain and posterior
fossa are unremarkable.

Visualized orbits: The imaged orbits are unremarkable.

Mastoids and visualized paranasal sinuses: There is mild mucosal
thickening in the maxillary sinuses. The imaged mastoid air cells
are clear.

Skeleton: There is no acute osseous abnormality.

Upper chest: The lung apices are clear.

Other: There is significant cerumen in left external auditory canal.
IMPRESSION: Left tonsillar abscess measuring 1.9 cm x 1.4 cm x 2.4 cm and right
tonsillar phlegmon versus early abscess measuring 1.7 cm x 1.3 cm x
3.2 cm. Swelling associated with these collections results and
partial effacement of the oropharyngeal airway.
